# Patient Record
Sex: Male | Born: 1960 | Race: White | Hispanic: No | Marital: Married | Smoking: Never smoker
Health system: Southern US, Community
[De-identification: ages and names within clinical notes are randomized; demographics above are authoritative.]

## PROBLEM LIST (undated history)

## (undated) DIAGNOSIS — H409 Unspecified glaucoma: Secondary | ICD-10-CM

## (undated) DIAGNOSIS — E119 Type 2 diabetes mellitus without complications: Secondary | ICD-10-CM

## (undated) DIAGNOSIS — Z8639 Personal history of other endocrine, nutritional and metabolic disease: Secondary | ICD-10-CM

## (undated) DIAGNOSIS — E875 Hyperkalemia: Secondary | ICD-10-CM

## (undated) DIAGNOSIS — E785 Hyperlipidemia, unspecified: Secondary | ICD-10-CM

## (undated) DIAGNOSIS — N189 Chronic kidney disease, unspecified: Secondary | ICD-10-CM

## (undated) DIAGNOSIS — I10 Essential (primary) hypertension: Secondary | ICD-10-CM

## (undated) DIAGNOSIS — E039 Hypothyroidism, unspecified: Secondary | ICD-10-CM

## (undated) HISTORY — PX: FRACTURE SURGERY: SHX138

## (undated) HISTORY — PX: OTHER SURGICAL HISTORY: SHX169

## (undated) HISTORY — PX: VITRECTOMY: SHX106

## (undated) HISTORY — PX: TRIGGER FINGER RELEASE: SHX641

## (undated) HISTORY — PX: WISDOM TOOTH EXTRACTION: SHX21

## (undated) HISTORY — PX: EYE SURGERY: SHX253

## (undated) HISTORY — PX: CATARACT EXTRACTION, BILATERAL: SHX1313

## (undated) HISTORY — PX: TONSILLECTOMY: SUR1361

---

## 2016-02-22 DIAGNOSIS — E875 Hyperkalemia: Secondary | ICD-10-CM | POA: Insufficient documentation

## 2016-02-22 DIAGNOSIS — I10 Essential (primary) hypertension: Secondary | ICD-10-CM | POA: Diagnosis present

## 2016-05-07 ENCOUNTER — Observation Stay (HOSPITAL_COMMUNITY)
Admission: EM | Admit: 2016-05-07 | Discharge: 2016-05-09 | Disposition: A | Discharge status: Home / Self Care | Payer: 59 | Attending: Family Medicine | Admitting: Family Medicine

## 2016-05-07 ENCOUNTER — Observation Stay (HOSPITAL_COMMUNITY): Payer: 59

## 2016-05-07 ENCOUNTER — Encounter (HOSPITAL_COMMUNITY): Payer: Self-pay | Admitting: Emergency Medicine

## 2016-05-07 DIAGNOSIS — Z794 Long term (current) use of insulin: Secondary | ICD-10-CM | POA: Diagnosis not present

## 2016-05-07 DIAGNOSIS — R1114 Bilious vomiting: Secondary | ICD-10-CM

## 2016-05-07 DIAGNOSIS — E1010 Type 1 diabetes mellitus with ketoacidosis without coma: Secondary | ICD-10-CM | POA: Diagnosis not present

## 2016-05-07 DIAGNOSIS — Z79899 Other long term (current) drug therapy: Secondary | ICD-10-CM | POA: Insufficient documentation

## 2016-05-07 DIAGNOSIS — E860 Dehydration: Secondary | ICD-10-CM | POA: Diagnosis present

## 2016-05-07 DIAGNOSIS — E875 Hyperkalemia: Secondary | ICD-10-CM | POA: Insufficient documentation

## 2016-05-07 DIAGNOSIS — I10 Essential (primary) hypertension: Secondary | ICD-10-CM

## 2016-05-07 DIAGNOSIS — I129 Hypertensive chronic kidney disease with stage 1 through stage 4 chronic kidney disease, or unspecified chronic kidney disease: Secondary | ICD-10-CM | POA: Diagnosis not present

## 2016-05-07 DIAGNOSIS — E039 Hypothyroidism, unspecified: Secondary | ICD-10-CM | POA: Diagnosis present

## 2016-05-07 DIAGNOSIS — Z7952 Long term (current) use of systemic steroids: Secondary | ICD-10-CM | POA: Insufficient documentation

## 2016-05-07 DIAGNOSIS — N179 Acute kidney failure, unspecified: Secondary | ICD-10-CM | POA: Diagnosis not present

## 2016-05-07 DIAGNOSIS — H409 Unspecified glaucoma: Secondary | ICD-10-CM | POA: Insufficient documentation

## 2016-05-07 DIAGNOSIS — E1310 Other specified diabetes mellitus with ketoacidosis without coma: Secondary | ICD-10-CM | POA: Diagnosis present

## 2016-05-07 DIAGNOSIS — E890 Postprocedural hypothyroidism: Secondary | ICD-10-CM

## 2016-05-07 DIAGNOSIS — E1110 Type 2 diabetes mellitus with ketoacidosis without coma: Secondary | ICD-10-CM | POA: Diagnosis present

## 2016-05-07 DIAGNOSIS — Z9641 Presence of insulin pump (external) (internal): Secondary | ICD-10-CM | POA: Diagnosis not present

## 2016-05-07 DIAGNOSIS — E1122 Type 2 diabetes mellitus with diabetic chronic kidney disease: Secondary | ICD-10-CM | POA: Diagnosis not present

## 2016-05-07 DIAGNOSIS — A419 Sepsis, unspecified organism: Secondary | ICD-10-CM

## 2016-05-07 DIAGNOSIS — N189 Chronic kidney disease, unspecified: Secondary | ICD-10-CM | POA: Diagnosis not present

## 2016-05-07 HISTORY — DX: Unspecified glaucoma: H40.9

## 2016-05-07 HISTORY — DX: Personal history of other endocrine, nutritional and metabolic disease: Z86.39

## 2016-05-07 HISTORY — DX: Essential (primary) hypertension: I10

## 2016-05-07 HISTORY — DX: Type 2 diabetes mellitus without complications: E11.9

## 2016-05-07 HISTORY — DX: Chronic kidney disease, unspecified: N18.9

## 2016-05-07 HISTORY — DX: Hyperlipidemia, unspecified: E78.5

## 2016-05-07 HISTORY — DX: Hypothyroidism, unspecified: E03.9

## 2016-05-07 HISTORY — DX: Hyperkalemia: E87.5

## 2016-05-07 LAB — BETA-HYDROXYBUTYRIC ACID: Beta-Hydroxybutyric Acid: 4.2 mmol/L — ABNORMAL HIGH (ref 0.05–0.27)

## 2016-05-07 LAB — RAPID URINE DRUG SCREEN, HOSP PERFORMED
AMPHETAMINES: NOT DETECTED
BENZODIAZEPINES: NOT DETECTED
Barbiturates: NOT DETECTED
Cocaine: NOT DETECTED
OPIATES: NOT DETECTED
Tetrahydrocannabinol: POSITIVE — AB

## 2016-05-07 LAB — BLOOD GAS, VENOUS
Acid-base deficit: 7.5 mmol/L — ABNORMAL HIGH (ref 0.0–2.0)
BICARBONATE: 16.5 mmol/L — AB (ref 20.0–28.0)
FIO2: 0.21
O2 Saturation: 70.9 %
PATIENT TEMPERATURE: 98.6
PCO2 VEN: 30.4 mmHg — AB (ref 44.0–60.0)
PO2 VEN: 42 mmHg (ref 32.0–45.0)
pH, Ven: 7.352 (ref 7.250–7.430)

## 2016-05-07 LAB — URINALYSIS, ROUTINE W REFLEX MICROSCOPIC
BILIRUBIN URINE: NEGATIVE
Bacteria, UA: NONE SEEN
HGB URINE DIPSTICK: NEGATIVE
Ketones, ur: 80 mg/dL — AB
Leukocytes, UA: NEGATIVE
NITRITE: NEGATIVE
Protein, ur: NEGATIVE mg/dL
SPECIFIC GRAVITY, URINE: 1.012 (ref 1.005–1.030)
Squamous Epithelial / LPF: NONE SEEN
pH: 5 (ref 5.0–8.0)

## 2016-05-07 LAB — BASIC METABOLIC PANEL
ANION GAP: 15 (ref 5–15)
BUN: 43 mg/dL — ABNORMAL HIGH (ref 6–20)
CALCIUM: 10.3 mg/dL (ref 8.9–10.3)
CO2: 17 mmol/L — ABNORMAL LOW (ref 22–32)
CREATININE: 1.41 mg/dL — AB (ref 0.61–1.24)
Chloride: 106 mmol/L (ref 101–111)
GFR, EST NON AFRICAN AMERICAN: 54 mL/min — AB (ref >60)
Glucose, Bld: 248 mg/dL — ABNORMAL HIGH (ref 65–99)
Potassium: 4 mmol/L (ref 3.5–5.1)
Sodium: 138 mmol/L (ref 135–145)

## 2016-05-07 LAB — CBC
HCT: 35.5 % — ABNORMAL LOW (ref 39.0–52.0)
HEMOGLOBIN: 13 g/dL (ref 13.0–17.0)
MCH: 31.7 pg (ref 26.0–34.0)
MCHC: 36.6 g/dL — AB (ref 30.0–36.0)
MCV: 86.6 fL (ref 78.0–100.0)
PLATELETS: 207 10*3/uL (ref 150–400)
RBC: 4.1 MIL/uL — AB (ref 4.22–5.81)
RDW: 12.2 % (ref 11.5–15.5)
WBC: 15.4 10*3/uL — ABNORMAL HIGH (ref 4.0–10.5)

## 2016-05-07 LAB — PROCALCITONIN

## 2016-05-07 LAB — TROPONIN I

## 2016-05-07 LAB — MAGNESIUM: MAGNESIUM: 2.2 mg/dL (ref 1.7–2.4)

## 2016-05-07 MED ORDER — SODIUM CHLORIDE 0.9 % IV SOLN
INTRAVENOUS | Status: DC
  Administered 2016-05-08: via INTRAVENOUS

## 2016-05-07 MED ORDER — SODIUM CHLORIDE 0.9 % IV SOLN
INTRAVENOUS | Status: DC
  Administered 2016-05-07: via INTRAVENOUS

## 2016-05-07 MED ORDER — INSULIN REGULAR HUMAN 100 UNIT/ML IJ SOLN: INTRAMUSCULAR | Status: DC

## 2016-05-07 MED ORDER — DEXTROSE-NACL 5-0.45 % IV SOLN
INTRAVENOUS | Status: DC
  Administered 2016-05-08 (×2): via INTRAVENOUS

## 2016-05-07 MED ORDER — PROMETHAZINE HCL 25 MG/ML IJ SOLN
25.0000 mg | Freq: Once | INTRAMUSCULAR | Status: AC
  Administered 2016-05-07: 25 mg via INTRAVENOUS
  Filled 2016-05-07: qty 1

## 2016-05-07 MED ORDER — LACTATED RINGERS IV BOLUS (SEPSIS)
1000.0000 mL | Freq: Once | INTRAVENOUS | Status: AC
  Administered 2016-05-07: 1000 mL via INTRAVENOUS

## 2016-05-07 MED ORDER — ONDANSETRON HCL 4 MG/2ML IJ SOLN
4.0000 mg | Freq: Once | INTRAMUSCULAR | Status: AC
  Administered 2016-05-07: 4 mg via INTRAVENOUS
  Filled 2016-05-07: qty 2

## 2016-05-07 MED ORDER — SODIUM CHLORIDE 0.9 % IV SOLN
INTRAVENOUS | Status: DC
  Administered 2016-05-08: 1.7 [IU]/h via INTRAVENOUS
  Filled 2016-05-07: qty 2.5

## 2016-05-07 MED ORDER — POTASSIUM CHLORIDE CRYS ER 20 MEQ PO TBCR: 20.0000 meq | EXTENDED_RELEASE_TABLET | Freq: Once | ORAL | Status: DC

## 2016-05-07 NOTE — ED Notes (Signed)
Notified RN,Spencer and EDP,Nanavati,MD., pt. I-stat CG4 Lactic acid results 3.67.

## 2016-05-07 NOTE — H&P (Signed)
History and Physical  Patient Name: Eugene Kirby     ZOX:[id number]    DOB: 1960/04/26    DOA: 05/07/2016 PCP: No PCP Per Patient   Patient coming from: Hotel/work  Chief Complaint: Vomiting, "I think I have DKA"  HPI: Eugene Kirby is a 56 y.o. male with a past medical history significant for T1DM on pump, HTN, and hypothyroidism who presents with vomiting for 1 day.  The patient lives in [place] and is in [place] for work. He recently started a new medicine Veltassa for hyperkalemia and over the last 3-4 days, he has had intermittent nausea, upset stomach, and malaise.  He has had no fever, cough, sputum production, dysuria, urinary frequency/urgency, flank pain.  He has had no chest pain/pressure or exertional symptoms, although he had some SOB episodes.  He has been having hyperglycemia, but his insulin pump is working as far as he knows.  Then tonight around 6PM he became suddenly quite a bit worse, much more nauseated, vomiting NBNB emesis repeatedly, with chills, and so he came to the ER.  ED course: -Afebrile, heart rate 92, respirations and pulse is normal, blood pressure 162/77 -Na 138, K 4.0, Cr 1.41 (baseline 0.9-1.0), WBC 15.4K, Hgb 13, BUN 43 -AG normal and VBG without acidosis but BHOB >4 and lactic acid 3.67 -He was given 1L NS and TRH were asked to evaluate  Per wife by phone, he gets DKA about once every 2-3 years, spontaneously.  Recently started on Veltassa by his nephrologist for persistent K>5.      ROS: Review of Systems  Constitutional: Positive for chills and malaise/fatigue.  Respiratory: Positive for shortness of breath (intermittent). Negative for cough, sputum production and wheezing.   Cardiovascular: Negative for chest pain.  Gastrointestinal: Positive for nausea and vomiting.  Genitourinary: Negative for dysuria, flank pain, frequency, hematuria and urgency.  Neurological: Positive for dizziness.  All other systems reviewed and are  negative.         Past Medical History:  Diagnosis Date  . Chronic kidney disease   . Diabetes mellitus without complication (HCC)   . Glaucoma   . Hyperkalemia   . Hypertension   . Hypothyroidism     Past Surgical History:  Procedure Laterality Date  . EYE SURGERY      Social History: Patient lives in [place] with his wife.  The patient walks unassisted.  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  Non-smoker.  Adopted.  No Known Allergies  Family history: family history is not known. He was adopted.  Prior to Admission medications   Medication Sig Start Date End Date Taking? Authorizing Provider  amLODipine (NORVASC) 10 MG tablet Take 10 mg by mouth daily. 04/02/16  Yes Historical Provider, MD  atorvastatin (LIPITOR) 40 MG tablet Take 1 tablet by mouth daily. 04/04/16  Yes Historical Provider, MD  brimonidine (ALPHAGAN) 0.2 % ophthalmic solution Place 1 drop into the left eye 3 (three) times daily. 04/16/16  Yes Historical Provider, MD  cholecalciferol (VITAMIN D) 1000 units tablet Take 1,000 Units by mouth daily.   Yes Historical Provider, MD  dorzolamide-timolol (COSOPT) 22.3-6.8 MG/ML ophthalmic solution Place 1-2 drops into the right eye 2 (two) times daily. 1 drop in right eye daily and 2 drops in the left eye twice daily 04/29/16  Yes Historical Provider, MD  HUMALOG 100 UNIT/ML injection 1 Units every hour. Per pump and carbs 04/04/16  Yes Historical Provider, MD  levothyroxine (SYNTHROID, LEVOTHROID) 75 MCG  tablet Take 1 tablet by mouth daily. 04/02/16  Yes Historical Provider, MD  levothyroxine (SYNTHROID, LEVOTHROID) 88 MCG tablet Take 1 tablet by mouth daily. 04/02/16  Yes Historical Provider, MD  lisinopril-hydrochlorothiazide (PRINZIDE,ZESTORETIC) 20-25 MG tablet Take 1 tablet by mouth daily. 04/23/16  Yes Historical Provider, MD  LUMIGAN 0.01 % SOLN Place 1 drop into the left eye at bedtime. 03/20/16  Yes Historical Provider, MD  omega-3 acid ethyl  esters (LOVAZA) 1 g capsule Take 1 g by mouth 2 (two) times daily.   Yes Historical Provider, MD  prednisoLONE acetate (PRED FORTE) 1 % ophthalmic suspension Place 1 drop into the right eye 3 (three) times daily. 02/28/16  Yes Historical Provider, MD  VELTASSA 8.4 g packet Take 1 packet by mouth daily. 04/23/16  Yes Historical Provider, MD       Physical Exam: BP (!) 150/84   Pulse 92   Temp 97.7 F (36.5 C) (Oral)   Resp 15   Ht 5\' 8"  (1.727 m)   Wt 75.8 kg (167 lb)   SpO2 100%   BMI 25.39 kg/m  General appearance: Well-developed, adult male, alert and in moderate distress from malaise.   Eyes: Anicteric, conjunctiva pink, lids and lashes normal. PERRL.    ENT: No nasal deformity, discharge, epistaxis.  Hearing normal. OP dry.   Neck: No neck masses.  Trachea midline.  No thyromegaly/tenderness. Lymph: No cervical or supraclavicular lymphadenopathy. Skin: Warm and dry.  No jaundice.  No suspicious rashes or lesions. Cardiac: Tachycardic, regular, nl S1-S2, no murmurs appreciated.  Capillary refill is brisk.  JVP not visible.  No LE edema.  Radial and DP pulses 2+ and symmetric. Respiratory: Normal respiratory rate and rhythm.  CTAB without rales or wheezes. Abdomen: Abdomen soft.  No TTP, guarding, rigidity or rebound. No ascites, distension, hepatosplenomegaly.   MSK: No deformities or effusions.  No cyanosis or clubbing. Neuro: Cranial nerves normal.  Sensation intact to light touch. Speech is fluent.  Muscle strength normal.    Psych: Sensorium intact and responding to questions, oriented, appears listless and sluggish, tired, though attention normal.  Affect blunted.  Judgment and insight appear normal.     Labs on Admission:  I have personally reviewed following labs and imaging studies: CBC:  Recent Labs Lab 05/07/16 2116  WBC 15.4*  HGB 13.0  HCT 35.5*  MCV 86.6  PLT 207   Basic Metabolic Panel:  Recent Labs Lab 05/07/16 2116  NA 138  K 4.0  CL 106  CO2  17*  GLUCOSE 248*  BUN 43*  CREATININE 1.41*  CALCIUM 10.3  MG 2.2   GFR: Estimated Creatinine Clearance: 56.6 mL/min (A) (by C-G formula based on SCr of 1.41 mg/dL (H)).  Liver Function Tests: No results for input(s): AST, ALT, ALKPHOS, BILITOT, PROT, ALBUMIN in the last 168 hours. No results for input(s): LIPASE, AMYLASE in the last 168 hours. No results for input(s): AMMONIA in the last 168 hours. Coagulation Profile: No results for input(s): INR, PROTIME in the last 168 hours. Cardiac Enzymes: No results for input(s): CKTOTAL, CKMB, CKMBINDEX, TROPONINI in the last 168 hours. BNP (last 3 results) No results for input(s): PROBNP in the last 8760 hours. HbA1C: No results for input(s): HGBA1C in the last 72 hours. CBG: No results for input(s): GLUCAP in the last 168 hours. Lipid Profile: No results for input(s): CHOL, HDL, LDLCALC, TRIG, CHOLHDL, LDLDIRECT in the last 72 hours. Thyroid Function Tests: No results for input(s): TSH, T4TOTAL, FREET4, T3FREE, THYROIDAB in  the last 72 hours. Anemia Panel: No results for input(s): VITAMINB12, FOLATE, FERRITIN, TIBC, IRON, RETICCTPCT in the last 72 hours. Sepsis Labs: Lactic acid 3.67 Invalid input(s): PROCALCITONIN, LACTICIDVEN No results found for this or any previous visit (from the past 240 hour(s)).       Radiological Exams on Admission: Personally reviewed CXR shows atelectasis, no pneumonia: Dg Chest Port 1 View  Result Date: 05/08/2016 CLINICAL DATA:  Initial evaluation for acute nausea, vomiting. EXAM: PORTABLE CHEST 1 VIEW COMPARISON:  None. FINDINGS: Mild cardiomegaly.  Mediastinal silhouette within normal limits. Lungs are hypoinflated. Mild perihilar vascular congestion without overt pulmonary edema. Linear opacity at the left lung base most consistent with subsegmental atelectasis. No focal infiltrates. No pleural effusion. No pneumothorax. No acute osseus abnormality. IMPRESSION: 1. Shallow lung inflation with mild  left basilar atelectasis. 2. Mild cardiomegaly with perihilar vascular congestion. No overt pulmonary edema. Electronically Signed   By: Rise Mu M.D.   On: 05/08/2016 00:27    EKG: Independently reviewed. Rate 104, QTc 467, sinus tachycardia, no ST changes. Left axis.        Assessment/Plan  1. Diabetic ketoacidosis in type 1 diabetes:  On pump at home.  No clear precipitating event.  I don't see that Veltassa causes DKA.  ?food poisoning (ate sushi last night). -2L NS now -Stop home insulin pump -Start insulin drip per protocol -Obtain chest x-ray -Obtain blood cultures -Obtain troponin -Trend electrolytes and BHOB   2. Hypertension:  Hypertensive on admission. -Continue amlodipine and HCTZ -Hold lisinopril given AKI  3. Acute kidney injury:  Baseline Cr 1.0 per notes, currently 1.4.  In setting of DKA and elevated BUN-creatinine ratio. -Fluids and trend BMP -Hold ACEi  4. Hyperkalemia:  -Hold Veltassa given vomiting  5. Hypothyroidism:  -Continue levothyroxine  6. Glaucoma:  -Hold nonessential eye drops given OBS status  7. Leukocytosis:  Clinically suspect his SIRS is driven by DKA, doubt infection at this time.      DVT prophylaxis: Lovenox  Code Status: FULL  Family Communication: Wife by phone  Disposition Plan: Anticipate IV fluids and insulin, close monitoring of electrolytes in stepdown.  If BHOB resolves overnight and hemodynamics improved/lactate clears, potentially home tomrorow afternoonw. Consults called: None Admission status: OBS At the point of initial evaluation, it is my clinical opinion that admission for OBSERVATION is reasonable and necessary because the patient's presenting complaints in the context of their chronic conditions represent sufficient risk of deterioration or significant morbidity to constitute reasonable grounds for close observation in the hospital setting, but that the patient may be medically stable for  discharge from the hospital within 24 to 48 hours.    Medical decision making: Patient seen at 11:36 PM on 05/07/2016.  The patient was discussed with Dr. Rhunette Croft.  What exists of the patient's chart was reviewed in depth and outsdie records from [facility] in [place] were reviewed and summarized above.  Clinical condition: tachycardic, but improving.  Will monitor in stepdown.        Alberteen Sam [facility] Pager [phone]

## 2016-05-07 NOTE — ED Notes (Signed)
Pt actively vomiting so unable to do ortho vitals

## 2016-05-07 NOTE — ED Notes (Addendum)
Patient's wife requesting updates.  [phone]

## 2016-05-07 NOTE — ED Triage Notes (Addendum)
Patient c/o nausea and vomiting x4 hours. Hx Type 1 diabetes. Patient reports "I may be in DKA." Denies abdominal pain. Patient reports last CBG 263.

## 2016-05-07 NOTE — ED Notes (Signed)
CBG 217  

## 2016-05-08 ENCOUNTER — Encounter (HOSPITAL_COMMUNITY): Payer: Self-pay

## 2016-05-08 DIAGNOSIS — E1310 Other specified diabetes mellitus with ketoacidosis without coma: Secondary | ICD-10-CM | POA: Diagnosis present

## 2016-05-08 DIAGNOSIS — E1010 Type 1 diabetes mellitus with ketoacidosis without coma: Secondary | ICD-10-CM | POA: Diagnosis not present

## 2016-05-08 LAB — GLUCOSE, CAPILLARY
GLUCOSE-CAPILLARY: 233 mg/dL — AB (ref 65–99)
GLUCOSE-CAPILLARY: 203 mg/dL — AB (ref 65–99)
GLUCOSE-CAPILLARY: 241 mg/dL — AB (ref 65–99)
GLUCOSE-CAPILLARY: 214 mg/dL — AB (ref 65–99)
GLUCOSE-CAPILLARY: 218 mg/dL — AB (ref 65–99)
GLUCOSE-CAPILLARY: 180 mg/dL — AB (ref 65–99)
GLUCOSE-CAPILLARY: 356 mg/dL — AB (ref 65–99)
Glucose-Capillary: 217 mg/dL — ABNORMAL HIGH (ref 65–99)
Glucose-Capillary: 220 mg/dL — ABNORMAL HIGH (ref 65–99)
Glucose-Capillary: 203 mg/dL — ABNORMAL HIGH (ref 65–99)
Glucose-Capillary: 173 mg/dL — ABNORMAL HIGH (ref 65–99)
Glucose-Capillary: 142 mg/dL — ABNORMAL HIGH (ref 65–99)
Glucose-Capillary: 137 mg/dL — ABNORMAL HIGH (ref 65–99)
Glucose-Capillary: 187 mg/dL — ABNORMAL HIGH (ref 65–99)
Glucose-Capillary: 342 mg/dL — ABNORMAL HIGH (ref 65–99)
Glucose-Capillary: 256 mg/dL — ABNORMAL HIGH (ref 65–99)

## 2016-05-08 LAB — BASIC METABOLIC PANEL
Anion gap: 13 (ref 5–15)
Anion gap: 9 (ref 5–15)
BUN: 35 mg/dL — AB (ref 6–20)
BUN: 27 mg/dL — AB (ref 6–20)
CALCIUM: 9.2 mg/dL (ref 8.9–10.3)
CHLORIDE: 108 mmol/L (ref 101–111)
CO2: 17 mmol/L — AB (ref 22–32)
CO2: 20 mmol/L — ABNORMAL LOW (ref 22–32)
CREATININE: 1.21 mg/dL (ref 0.61–1.24)
CREATININE: 1.1 mg/dL (ref 0.61–1.24)
Calcium: 9.4 mg/dL (ref 8.9–10.3)
Chloride: 109 mmol/L (ref 101–111)
GFR calc Af Amer: >60 mL/min (ref >60)
GFR calc Af Amer: >60 mL/min (ref >60)
GFR calc non Af Amer: >60 mL/min (ref >60)
GLUCOSE: 195 mg/dL — AB (ref 65–99)
Glucose, Bld: 230 mg/dL — ABNORMAL HIGH (ref 65–99)
Potassium: 3.7 mmol/L (ref 3.5–5.1)
Potassium: 3.4 mmol/L — ABNORMAL LOW (ref 3.5–5.1)
SODIUM: 138 mmol/L (ref 135–145)
Sodium: 138 mmol/L (ref 135–145)

## 2016-05-08 LAB — MRSA PCR SCREENING: MRSA by PCR: NEGATIVE

## 2016-05-08 LAB — CG4 I-STAT (LACTIC ACID): Lactic Acid, Venous: 3.67 mmol/L (ref 0.5–1.9)

## 2016-05-08 LAB — LACTIC ACID, PLASMA: Lactic Acid, Venous: 1.4 mmol/L (ref 0.5–1.9)

## 2016-05-08 LAB — BETA-HYDROXYBUTYRIC ACID: BETA-HYDROXYBUTYRIC ACID: 3.25 mmol/L — AB (ref 0.05–0.27)

## 2016-05-08 LAB — GLUCOSE, RANDOM: GLUCOSE: 365 mg/dL — AB (ref 65–99)

## 2016-05-08 MED ORDER — LEVOTHYROXINE SODIUM 88 MCG PO TABS
88.0000 ug | ORAL_TABLET | Freq: Every day | ORAL | Status: DC
  Administered 2016-05-08 – 2016-05-09 (×2): 88 ug via ORAL
  Filled 2016-05-08 (×2): qty 1

## 2016-05-08 MED ORDER — INSULIN GLARGINE 100 UNIT/ML ~~LOC~~ SOLN
8.0000 [IU] | SUBCUTANEOUS | Status: DC
  Filled 2016-05-08: qty 0.08

## 2016-05-08 MED ORDER — ACETAMINOPHEN 325 MG PO TABS: 650.0000 mg | ORAL_TABLET | Freq: Four times a day (QID) | ORAL | Status: DC | PRN

## 2016-05-08 MED ORDER — ENOXAPARIN SODIUM 40 MG/0.4ML ~~LOC~~ SOLN
40.0000 mg | Freq: Every day | SUBCUTANEOUS | Status: DC
  Administered 2016-05-08: 40 mg via SUBCUTANEOUS
  Filled 2016-05-08: qty 0.4

## 2016-05-08 MED ORDER — AMLODIPINE BESYLATE 10 MG PO TABS
10.0000 mg | ORAL_TABLET | Freq: Every day | ORAL | Status: DC
  Administered 2016-05-08 – 2016-05-09 (×2): 10 mg via ORAL
  Filled 2016-05-08 (×2): qty 1

## 2016-05-08 MED ORDER — HYDRALAZINE HCL 20 MG/ML IJ SOLN
10.0000 mg | Freq: Once | INTRAMUSCULAR | Status: AC
  Administered 2016-05-08: 10 mg via INTRAVENOUS
  Filled 2016-05-08: qty 1

## 2016-05-08 MED ORDER — ONDANSETRON HCL 4 MG/2ML IJ SOLN
4.0000 mg | Freq: Four times a day (QID) | INTRAMUSCULAR | Status: DC | PRN
  Administered 2016-05-08 (×2): 4 mg via INTRAVENOUS
  Filled 2016-05-08 (×2): qty 2

## 2016-05-08 MED ORDER — CALCIUM CARBONATE ANTACID 500 MG PO CHEW: 1.0000 | CHEWABLE_TABLET | Freq: Every day | ORAL | Status: DC | PRN

## 2016-05-08 MED ORDER — ATORVASTATIN CALCIUM 40 MG PO TABS
40.0000 mg | ORAL_TABLET | Freq: Every day | ORAL | Status: DC
  Administered 2016-05-08 – 2016-05-09 (×2): 40 mg via ORAL
  Filled 2016-05-08 (×2): qty 1

## 2016-05-08 MED ORDER — FAMOTIDINE 20 MG PO TABS
40.0000 mg | ORAL_TABLET | Freq: Every day | ORAL | Status: DC
  Administered 2016-05-08 – 2016-05-09 (×2): 40 mg via ORAL
  Filled 2016-05-08 (×2): qty 2

## 2016-05-08 MED ORDER — SIMETHICONE 80 MG PO CHEW: 80.0000 mg | CHEWABLE_TABLET | Freq: Every day | ORAL | Status: DC | PRN

## 2016-05-08 MED ORDER — PROMETHAZINE HCL 25 MG/ML IJ SOLN
12.5000 mg | INTRAMUSCULAR | Status: DC | PRN
  Administered 2016-05-08 – 2016-05-09 (×4): 25 mg via INTRAVENOUS
  Filled 2016-05-08 (×4): qty 1

## 2016-05-08 MED ORDER — POTASSIUM CHLORIDE 20 MEQ/15ML (10%) PO SOLN
20.0000 meq | Freq: Once | ORAL | Status: AC
  Administered 2016-05-08: 20 meq via ORAL
  Filled 2016-05-08: qty 15

## 2016-05-08 MED ORDER — INSULIN PUMP
Freq: Three times a day (TID) | SUBCUTANEOUS | Status: DC
  Administered 2016-05-08: 5.6 via SUBCUTANEOUS
  Administered 2016-05-08: 1 via SUBCUTANEOUS
  Administered 2016-05-08: 4 via SUBCUTANEOUS
  Administered 2016-05-09: 1.2 via SUBCUTANEOUS
  Administered 2016-05-09: 02:00:00 via SUBCUTANEOUS
  Filled 2016-05-08: qty 1

## 2016-05-08 MED ORDER — HYDROCHLOROTHIAZIDE 25 MG PO TABS
25.0000 mg | ORAL_TABLET | Freq: Every day | ORAL | Status: DC
  Administered 2016-05-08 – 2016-05-09 (×2): 25 mg via ORAL
  Filled 2016-05-08 (×2): qty 1

## 2016-05-08 MED ORDER — SODIUM CHLORIDE 0.9 % IV BOLUS (SEPSIS)
1000.0000 mL | Freq: Once | INTRAVENOUS | Status: AC
  Administered 2016-05-08: 1000 mL via INTRAVENOUS

## 2016-05-08 MED ORDER — ONDANSETRON HCL 4 MG PO TABS: 4.0000 mg | ORAL_TABLET | Freq: Four times a day (QID) | ORAL | Status: DC | PRN

## 2016-05-08 MED ORDER — LEVOTHYROXINE SODIUM 50 MCG PO TABS
75.0000 ug | ORAL_TABLET | Freq: Every day | ORAL | Status: DC
  Administered 2016-05-08 – 2016-05-09 (×2): 75 ug via ORAL
  Filled 2016-05-08 (×2): qty 1

## 2016-05-08 MED ORDER — PROMETHAZINE HCL 25 MG/ML IJ SOLN
25.0000 mg | Freq: Once | INTRAMUSCULAR | Status: AC
  Administered 2016-05-08: 25 mg via INTRAVENOUS
  Filled 2016-05-08: qty 1

## 2016-05-08 MED ORDER — LABETALOL HCL 5 MG/ML IV SOLN
10.0000 mg | INTRAVENOUS | Status: AC | PRN
  Administered 2016-05-08 (×2): 10 mg via INTRAVENOUS
  Filled 2016-05-08 (×2): qty 4

## 2016-05-08 MED ORDER — ACETAMINOPHEN 650 MG RE SUPP: 650.0000 mg | Freq: Four times a day (QID) | RECTAL | Status: DC | PRN

## 2016-05-08 NOTE — Care Management Note (Signed)
Case Management Note  Patient Details  Name: Eugene Kirby MRN: [id number] Date of Birth: 19-May-1960  Subjective/Objective:      Hyperglycemia and poss early sepsis              Action/Plan:Date:  May 08, 2016 Chart reviewed for concurrent status and case management needs. Will continue to follow patient progress. Discharge Planning: following for needs Expected discharge date: [id number] Marcelle SmilingRhonda Loris Winrow, BSN, [place] RunRN3, [place]M   [phone]   Expected Discharge Date:   (unknown)               Expected Discharge Plan:  Home/Self Care  In-House Referral:     Discharge planning Services     Post Acute Care Choice:    Choice offered to:     DME Arranged:    DME Agency:     HH Arranged:    HH Agency:     Status of Service:  In process, will continue to follow  If discussed at Long Length of Stay Meetings, dates discussed:    Additional Comments:  Golda AcreDavis, Clarise Chacko Lynn, RN 05/08/2016, 9:51 AM

## 2016-05-08 NOTE — Progress Notes (Signed)
Eugene Kirby FHL:[id number] DOB: March 08, 1960 DOA: 05/07/2016 PCP: No PCP Per Patient  Brief narrative:  56 y/o ? from [place] visiting [place] 1 DM x 50 yr with functioning insulin pump [medtronic] placed 7 yrs ago Htn Hypothyroid HLD Glaucoma? Hyperkalemia newly started on potassium binder Veltassa ~ 2 wk prior under care [facility] Dr. Collie Siad  Admit with n/v for 3-4 days and found to be in DKA   Past medical history-As per Problem list Chart reviewed as below-   Consultants:    Procedures:    Antibiotics:     Subjective   Nauseated.  No vomit just yet. No diarrhea No chills no fever   Objective    Interim History:   Telemetry:    Objective: Vitals:   05/08/16 0317 05/08/16 0400 05/08/16 0500 05/08/16 0600  BP:  (!) 168/48 (!) 151/50 (!) 164/52  Pulse:  (!) 124 (!) 111 (!) 124  Resp:  (!) 27 (!) 23 (!) 26  Temp: 98.2 F (36.8 C)     TempSrc: Oral     SpO2:  97% 98% 98%  Weight:      Height:        Intake/Output Summary (Last 24 hours) at 05/08/16 0741 Last data filed at 05/08/16 0500  Gross per 24 hour  Intake          2253.58 ml  Output              950 ml  Net          1303.58 ml    Exam:  General: eomi ncat.  Looks fair Cardiovascular: s1 s 2 slight tachy Respiratory: clear no adde dsound Abdomen:  Soft-pump presnet Skin no le edema [place]  Data Reviewed: Basic Metabolic Panel:  Recent Labs Lab 05/07/16 2116 05/08/16 0244 05/08/16 0659  NA 138 138 138  K 4.0 3.7 3.4*  CL 106 108 109  CO2 17* 17* 20*  GLUCOSE 248* 230* 195*  BUN 43* 35* 27*  CREATININE 1.41* 1.21 1.10  CALCIUM 10.3 9.4 9.2  MG 2.2  --   --    Liver Function Tests: No results for input(s): AST, ALT, ALKPHOS, BILITOT, PROT, ALBUMIN in the last 168 hours. No results for input(s): LIPASE, AMYLASE in the last 168 hours. No results for input(s): AMMONIA in the last 168 hours. CBC:  Recent Labs Lab 05/07/16 2116  WBC 15.4*  HGB 13.0    HCT 35.5*  MCV 86.6  PLT 207   Cardiac Enzymes:  Recent Labs Lab 05/07/16 2116  TROPONINI <0.03   BNP: Invalid input(s): POCBNP CBG:  Recent Labs Lab 05/08/16 0217 05/08/16 0316 05/08/16 0414 05/08/16 0522 05/08/16 0623  GLUCAP 241* 214* 218* 180* 203*    Recent Results (from the past 240 hour(s))  MRSA PCR Screening     Status: None   Collection Time: 05/08/16  1:20 AM  Result Value Ref Range Status   MRSA by PCR NEGATIVE NEGATIVE Final    Comment:        The GeneXpert MRSA Assay (FDA approved for NASAL specimens only), is one component of a comprehensive MRSA colonization surveillance program. It is not intended to diagnose MRSA infection nor to guide or monitor treatment for MRSA infections.      Studies:              All Imaging reviewed and is as per above notation   Scheduled Meds: . amLODipine  10 mg Oral Daily  . atorvastatin  40 mg Oral Daily  . enoxaparin (LOVENOX) injection  40 mg Subcutaneous QHS  . hydrochlorothiazide  25 mg Oral Daily  . levothyroxine  75 mcg Oral QAC breakfast  . levothyroxine  88 mcg Oral QAC breakfast   Continuous Infusions: . sodium chloride Stopped (05/08/16 0115)  . dextrose 5 % and 0.45% NaCl 125 mL/hr at 05/08/16 0400  . insulin (NOVOLIN-R) infusion 6.8 Units/hr (05/08/16 0736)     Assessment/Plan:  1. DKA-gap closed quickly, still mild met acidosis with co2 20-no longer requires SDU status and can tx to reg floor if stabilizes in next 6-8 hr. 2. N/v ? Related to DKA vs viral illness.  If persists get lipase and rule out other organic causes 3. Ty1 DM with insulin pump-Might need to have it checked.  WIll ask DM coordinator to see--check sugars q4 given nausea 4. Htn-resumed HCTZ 25, Amlodipine 10 5. Acute kindey injury, admit BUn/Creat 43/1.4-->27/1.1 in setting DKA/Vomiting-resolved with IVF-for now continue Dextrose and increase to 150 cc/hr 6. Hypothyroidism-cont synthroid 88 mcg-verify dose on  d/c 7. hld-cont lipitor 40 daily    Transfer to Med surg later today Monitor overnight as still quesy and hasnt taken PO yet and needs recheck labs for hypokalemia Changing to inpatient  Verneita Griffes, MD  [facility] Pager [phone] 05/08/2016, 7:41 AM    LOS: 0 days

## 2016-05-08 NOTE — Progress Notes (Signed)
Pt hypertensive to the 180s SBP. Donnamarie PoagK. Kirby notified. 10mg  Hydralazine ordered and given.

## 2016-05-08 NOTE — Progress Notes (Signed)
Inpatient Diabetes Program Recommendations  AACE/ADA: New Consensus Statement on Inpatient Glycemic Control (2015)  Target Ranges:  Prepandial:   less than 140 mg/dL      Peak postprandial:   less than 180 mg/dL (1-2 hours)      Critically ill patients:  140 - 180 mg/dL   Lab Results  Component Value Date   GLUCAP 137 (H) 05/08/2016    Review of Glycemic Control  Diabetes history: DM1 Outpatient Diabetes medications: Insulin Pump Current orders for Inpatient glycemic control: IV insulin to insulin pump  Inpatient Diabetes Program Recommendations:    Continue insulin drip x 1 hour after insulin pump restarted.  Pt has CGM and will check blood sugars and adjust according to hospital meter. Will have pt sign Insulin Pump Contract and give sheet to record amount that he boluses.  Insulin Pump Settings: Basal: 12 MN - 1.3 units 0430  - 1.55 units Total: appox 51 units  Bolus: Goal 120 mg/dL 12MN - 40 [id number] - 50 CHO ratio: 1:8  Pt has new site. Feels like he may have gotten a "bug" or ate something bad. (Had sushi PTA). Had DM1 since age 89. Very well managed. Has CGM. Sees endo on regular basis in [place]. Answered questions. Will follow while inpatient.  Thank you. Ailene Ardshonda Deysi Soldo, RD, LDN, CDE Inpatient Diabetes Coordinator [phone]

## 2016-05-08 NOTE — ED Provider Notes (Signed)
[facility]-EMERGENCY DEPT Provider Note   CSN: [id number] Arrival date & time: 05/07/16  2032     History   Chief Complaint Chief Complaint  Patient presents with  . Hyperglycemia    HPI Eugene Kirby is a 56 y.o. male.  HPI  Pt comes in with cc of nausea and emesis. Pt has hx of Type 1 DM. Pt reports that he started feeling unwell during the day, and in the evening he started having severe nausea and emesis. Pt has had 20+ rounds of emesis, now bilious. Pt has had 3 loose BM. Pt denies any severe abd pain. Patient has no pain with urination, blood in the urine, or frequent urination.Pt denies any recent travel hx, or suspicious po intake. Pt started getting dizzy and felt that he might faint prior to ER arrival.  Past Medical History:  Diagnosis Date  . Chronic kidney disease   . Diabetes mellitus without complication (HCC)   . Glaucoma   . Hyperkalemia   . Hypertension   . Hypothyroidism     Patient Active Problem List   Diagnosis Date Noted  . DKA (diabetic ketoacidoses) (HCC) 05/07/2016  . Glaucoma 05/07/2016  . Hypothyroidism 05/07/2016  . AKI (acute kidney injury) (HCC) 05/07/2016  . Essential hypertension 02/22/2016  . Hyperkalemia 02/22/2016    Past Surgical History:  Procedure Laterality Date  . EYE SURGERY         Home Medications    Prior to Admission medications   Medication Sig Start Date End Date Taking? Authorizing Provider  amLODipine (NORVASC) 10 MG tablet Take 10 mg by mouth daily. 04/02/16  Yes Historical Provider, MD  atorvastatin (LIPITOR) 40 MG tablet Take 1 tablet by mouth daily. 04/04/16  Yes Historical Provider, MD  brimonidine (ALPHAGAN) 0.2 % ophthalmic solution Place 1 drop into the left eye 3 (three) times daily. 04/16/16  Yes Historical Provider, MD  cholecalciferol (VITAMIN D) 1000 units tablet Take 1,000 Units by mouth daily.   Yes Historical Provider, MD  dorzolamide-timolol (COSOPT) 22.3-6.8 MG/ML ophthalmic solution Place 1-2 drops  into the right eye 2 (two) times daily. 1 drop in right eye daily and 2 drops in the left eye twice daily 04/29/16  Yes Historical Provider, MD  HUMALOG 100 UNIT/ML injection 1 Units every hour. Per pump and carbs 04/04/16  Yes Historical Provider, MD  levothyroxine (SYNTHROID, LEVOTHROID) 75 MCG tablet Take 1 tablet by mouth daily. 04/02/16  Yes Historical Provider, MD  levothyroxine (SYNTHROID, LEVOTHROID) 88 MCG tablet Take 1 tablet by mouth daily. 04/02/16  Yes Historical Provider, MD  lisinopril-hydrochlorothiazide (PRINZIDE,ZESTORETIC) 20-25 MG tablet Take 1 tablet by mouth daily. 04/23/16  Yes Historical Provider, MD  LUMIGAN 0.01 % SOLN Place 1 drop into the left eye at bedtime. 03/20/16  Yes Historical Provider, MD  omega-3 acid ethyl esters (LOVAZA) 1 g capsule Take 1 g by mouth 2 (two) times daily.   Yes Historical Provider, MD  prednisoLONE acetate (PRED FORTE) 1 % ophthalmic suspension Place 1 drop into the right eye 3 (three) times daily. 02/28/16  Yes Historical Provider, MD  VELTASSA 8.4 g packet Take 1 packet by mouth daily. 04/23/16  Yes Historical Provider, MD    Family History Family History  Problem Relation Age of Onset  . Adopted: Yes    Social History Social History  Substance Use Topics  . Smoking status: Never Smoker  . Smokeless tobacco: Never Used  . Alcohol use Not on file     Allergies   Patient  has no known allergies.   Review of Systems Review of Systems  ROS 10 Systems reviewed and are negative for acute change except as noted in the HPI.     Physical Exam Updated Vital Signs BP (!) 160/64 (BP Location: Left Arm)   Pulse (!) 102   Temp 97.7 F (36.5 C) (Oral)   Resp 11   Ht 5\' 8"  (1.727 m)   Wt 167 lb (75.8 kg)   SpO2 100%   BMI 25.39 kg/m   Physical Exam  Constitutional: He is oriented to person, place, and time. He appears well-developed.  HENT:  Head: Normocephalic and atraumatic.  Dry mucus membrane  Eyes: Conjunctivae and EOM are  normal. Pupils are equal, round, and reactive to light.  Neck: Normal range of motion. Neck supple.  Cardiovascular: Normal rate and regular rhythm.   tachycardia  Pulmonary/Chest: Effort normal and breath sounds normal.  Abdominal: Soft. Bowel sounds are normal. He exhibits no distension and no mass. There is no tenderness. There is no rebound and no guarding.  Musculoskeletal: He exhibits no deformity.  Neurological: He is alert and oriented to person, place, and time.  Skin: Skin is warm.  Nursing note and vitals reviewed.    ED Treatments / Results  Labs (all labs ordered are listed, but only abnormal results are displayed) Labs Reviewed  BASIC METABOLIC PANEL - Abnormal; Notable for the following:       Result Value   CO2 17 (*)    Glucose, Bld 248 (*)    BUN 43 (*)    Creatinine, Ser 1.41 (*)    GFR calc non Af Amer 54 (*)    All other components within normal limits  CBC - Abnormal; Notable for the following:    WBC 15.4 (*)    RBC 4.10 (*)    HCT 35.5 (*)    MCHC 36.6 (*)    All other components within normal limits  URINALYSIS, ROUTINE W REFLEX MICROSCOPIC - Abnormal; Notable for the following:    Color, Urine STRAW (*)    Glucose, UA >=500 (*)    Ketones, ur 80 (*)    All other components within normal limits  BETA-HYDROXYBUTYRIC ACID - Abnormal; Notable for the following:    Beta-Hydroxybutyric Acid 4.20 (*)    All other components within normal limits  BLOOD GAS, VENOUS - Abnormal; Notable for the following:    pCO2, Ven 30.4 (*)    Bicarbonate 16.5 (*)    Acid-base deficit 7.5 (*)    All other components within normal limits  RAPID URINE DRUG SCREEN, HOSP PERFORMED - Abnormal; Notable for the following:    Tetrahydrocannabinol POSITIVE (*)    All other components within normal limits  CULTURE, BLOOD (ROUTINE X 2)  CULTURE, BLOOD (ROUTINE X 2)  URINE CULTURE  MAGNESIUM  PROCALCITONIN  TROPONIN I  BASIC METABOLIC PANEL  LACTIC ACID, PLASMA  LACTIC  ACID, PLASMA  HEMOGLOBIN A1C  CBG MONITORING, ED  I-STAT CG4 LACTIC ACID, ED    EKG  EKG Interpretation  Date/Time:  Wednesday May 07 2016 23:28:24 EDT Ventricular Rate:  104 PR Interval:    QRS Duration: 92 QT Interval:  355 QTC Calculation: 467 R Axis:   -44 Text Interpretation:  Sinus tachycardia Left axis deviation No old tracing to compare No acute changes Confirmed by Rhunette CroftNANAVATI, MD, Janey GentaANKIT [phone]([id number]) on 05/08/2016 12:57:28 AM       Radiology Dg Chest Port 1 View  Result Date: 05/08/2016 CLINICAL DATA:  Initial evaluation for acute nausea, vomiting. EXAM: PORTABLE CHEST 1 VIEW COMPARISON:  None. FINDINGS: Mild cardiomegaly.  Mediastinal silhouette within normal limits. Lungs are hypoinflated. Mild perihilar vascular congestion without overt pulmonary edema. Linear opacity at the left lung base most consistent with subsegmental atelectasis. No focal infiltrates. No pleural effusion. No pneumothorax. No acute osseus abnormality. IMPRESSION: 1. Shallow lung inflation with mild left basilar atelectasis. 2. Mild cardiomegaly with perihilar vascular congestion. No overt pulmonary edema. Electronically Signed   By: Rise Mu M.D.   On: 05/08/2016 00:27    Procedures Procedures (including critical care time)  CRITICAL CARE Performed by: Derwood Kaplan   Total critical care time: 45 minutes  Critical care time was exclusive of separately billable procedures and treating other patients.  Critical care was necessary to treat or prevent imminent or life-threatening deterioration.  Critical care was time spent personally by me on the following activities: development of treatment plan with patient and/or surrogate as well as nursing, discussions with consultants, evaluation of patient's response to treatment, examination of patient, obtaining history from patient or surrogate, ordering and performing treatments and interventions, ordering and review of laboratory studies,  ordering and review of radiographic studies, pulse oximetry and re-evaluation of patient's condition.   Medications Ordered in ED Medications  0.9 %  sodium chloride infusion ( Intravenous Stopped 05/08/16 0019)  0.9 %  sodium chloride infusion ( Intravenous New Bag/Given 05/08/16 0001)  dextrose 5 %-0.45 % sodium chloride infusion (not administered)  insulin regular (NOVOLIN R,HUMULIN R) 250 Units in sodium chloride 0.9 % 250 mL (1 Units/mL) infusion (1.7 Units/hr Intravenous New Bag/Given 05/08/16 0002)  lactated ringers bolus 1,000 mL (0 mLs Intravenous Stopped 05/07/16 2358)  ondansetron (ZOFRAN) injection 4 mg (4 mg Intravenous Given 05/07/16 2157)  promethazine (PHENERGAN) injection 25 mg (25 mg Intravenous Given 05/07/16 2225)     Initial Impression / Assessment and Plan / ED Course  I have reviewed the triage vital signs and the nursing notes.  Pertinent labs & imaging results that were available during my care of the patient were reviewed by me and considered in my medical decision making (see chart for details).     Pt comes in with cc of nausea, emesis. He is diabetic. Pt is dehydrated and having orthostatic dizziness.  Labs reveals low bicarb of 17.  The metabolic acidosis could be due to starvation ketosis, or could be due to an early DKA. Anion gap is at 15. BOHB is elevated-  So after talking to medicine, we have decided to start DKA protocol.  Pt's lactate > 3. The patient is noted to have a lactate>2. With the current information available to me, I don't think the patient is in septic shock. The lactate>2, is related to OTHER SHOCK DKA and dehydration. Therefore, I have not ordered any blood cultures or antibiotics.   Final Clinical Impressions(s) / ED Diagnoses   Final diagnoses:  Type 1 diabetes mellitus with ketoacidosis without coma (HCC)  Bilious vomiting with nausea  Severe dehydration  AKI (acute kidney injury) [facility])    New Prescriptions New Prescriptions    No medications on file     Derwood Kaplan, MD 05/08/16 0106

## 2016-05-08 NOTE — Progress Notes (Signed)
Transferred to room 1337 via w/c. C/o dry heaves .

## 2016-05-08 NOTE — Progress Notes (Signed)
Pt still hypertensive SBP high 160s. Tachycardic to the 120s and continued N/V after zofran. Kirby notified. 1L NS bolus ordered and given, 10mg  labetalol ordered and given, 25mg  phenergan ordered and given.

## 2016-05-08 NOTE — ED Notes (Signed)
BSG 233, [facility] did not transfer over.

## 2016-05-08 NOTE — Progress Notes (Signed)
Inpatient Diabetes Program Recommendations  AACE/ADA: New Consensus Statement on Inpatient Glycemic Control (2015)  Target Ranges:  Prepandial:   less than 140 mg/dL      Peak postprandial:   less than 180 mg/dL (1-2 hours)      Critically ill patients:  140 - 180 mg/dL   Lab Results  Component Value Date   GLUCAP 142 (H) 05/08/2016   Inpatient Diabetes Program Recommendations:  Spoke to RN caring for patient- she had placed a page to the on call coordinator.  Patient wears an insulin pump- MD is ready to transition patient off insulin drip to insulin pump as gap is closed and CO2 is 20.  Patient will need to call the 1-800 number on the back of the pump and complete a safety check with the pump before restarting it.  Patient should use a new tubing and new site to restart pump.    Do not recommend Lantus insulin now if patient is to transition to the pump as the pump will deliver basal and bolus insulin.   Susette RacerJulie Danyel Griess, RN, BA, MHA, CDE Diabetes Coordinator Inpatient Diabetes Program  [phone] (Team Pager) [phone] [facility] Office) 05/08/2016 9:19 AM

## 2016-05-08 NOTE — Progress Notes (Signed)
Insulin drip stopped at 1045 ,or one hour after insulin pump started by patient. CBG 187 by hospital meter and 186 per pts insulin pump. Patient gave 1 unit insulin per insulin pump.Nausea was relieved with phenergan 12.5 mgm iv.Appetite is poor at this time.

## 2016-05-09 DIAGNOSIS — E1010 Type 1 diabetes mellitus with ketoacidosis without coma: Secondary | ICD-10-CM | POA: Diagnosis not present

## 2016-05-09 LAB — CBC WITH DIFFERENTIAL/PLATELET
BASOS ABS: 0 10*3/uL (ref 0.0–0.1)
Basophils Relative: 0 %
Eosinophils Absolute: 0 10*3/uL (ref 0.0–0.7)
Eosinophils Relative: 0 %
HEMATOCRIT: 35.7 % — AB (ref 39.0–52.0)
HEMOGLOBIN: 12.8 g/dL — AB (ref 13.0–17.0)
LYMPHS PCT: 14 %
Lymphs Abs: 2.1 10*3/uL (ref 0.7–4.0)
MCH: 32.2 pg (ref 26.0–34.0)
MCHC: 35.9 g/dL (ref 30.0–36.0)
MCV: 89.7 fL (ref 78.0–100.0)
MONO ABS: 1.3 10*3/uL — AB (ref 0.1–1.0)
MONOS PCT: 9 %
NEUTROS ABS: 11.5 10*3/uL — AB (ref 1.7–7.7)
NEUTROS PCT: 77 %
Platelets: 195 10*3/uL (ref 150–400)
RBC: 3.98 MIL/uL — ABNORMAL LOW (ref 4.22–5.81)
RDW: 12.3 % (ref 11.5–15.5)
WBC: 14.9 10*3/uL — ABNORMAL HIGH (ref 4.0–10.5)

## 2016-05-09 LAB — BASIC METABOLIC PANEL
Anion gap: 8 (ref 5–15)
BUN: 22 mg/dL — ABNORMAL HIGH (ref 6–20)
CHLORIDE: 105 mmol/L (ref 101–111)
CO2: 25 mmol/L (ref 22–32)
Calcium: 9.8 mg/dL (ref 8.9–10.3)
Creatinine, Ser: 1.02 mg/dL (ref 0.61–1.24)
GLUCOSE: 225 mg/dL — AB (ref 65–99)
POTASSIUM: 3.8 mmol/L (ref 3.5–5.1)
SODIUM: 138 mmol/L (ref 135–145)

## 2016-05-09 LAB — GLUCOSE, CAPILLARY
GLUCOSE-CAPILLARY: 178 mg/dL — AB (ref 65–99)
Glucose-Capillary: 224 mg/dL — ABNORMAL HIGH (ref 65–99)

## 2016-05-09 LAB — URINE CULTURE: CULTURE: NO GROWTH

## 2016-05-09 LAB — HEMOGLOBIN A1C
Hgb A1c MFr Bld: 8.4 % — ABNORMAL HIGH (ref 4.8–5.6)
Mean Plasma Glucose: 194 mg/dL

## 2016-05-09 LAB — PROCALCITONIN: Procalcitonin: 6.7 ng/mL

## 2016-05-09 MED ORDER — FAMOTIDINE 40 MG PO TABS: 40.0000 mg | ORAL_TABLET | Freq: Every day | ORAL | 0 refills | Status: AC

## 2016-05-09 MED ORDER — ONDANSETRON HCL 4 MG PO TABS: 4.0000 mg | ORAL_TABLET | Freq: Three times a day (TID) | ORAL | 0 refills | Status: AC | PRN

## 2016-05-09 NOTE — Progress Notes (Signed)
Pt discharged home with spouse in stable condition. Discharge instructions and scripts given. Pt verbalized understanding. No immediate questions or concerns at this time.  

## 2016-05-09 NOTE — Discharge Summary (Signed)
Physician Discharge Summary  Eugene Kirby:[id number] DOB: [birth year] DOA: 05/07/2016  PCP: No PCP Per Patient  Admit date: 05/07/2016 Discharge date: 05/09/2016  Time spent: 45 minutes  Recommendations for Outpatient Follow-up:  1. Holding Prinvil 2/2 to hyperkalemia 2. Needs bmet 1 wk 3. Recommend outpatient titration of diabetes mellitus and diabetic goals 4. Might require TSH as an outpatient  Discharge Diagnoses:  Principal Problem:   DKA (diabetic ketoacidoses) (HCC) Active Problems:   Essential hypertension   Hypothyroidism   AKI (acute kidney injury) (HCC)   Secondary DM with DKA (HCC)   Discharge Condition: Good  Diet recommendation: Diabetic heart healthy  Filed Weights   05/07/16 2042  Weight: 75.8 kg (167 lb)    History of present illness:  56 y/o ? from [place] visiting [place] 1 DM x 50 yr with functioning insulin pump [medtronic] placed 7 yrs ago Htn Hypothyroid HLD Glaucoma? Hyperkalemia newly started on potassium binder Veltassa ~ 2 wk prior under care [facility] Dr. Dana Allan  Admit with n/v for 3-4 days and found to be in DKA  DKA resolved very quickly and was under control within 24 hours. The patient also was taken off of his Prinivil because of better controlled hypertension. He was also discontinued off of his potassium binder which was not needed as lisinopril was the cause of hyperkalemia and this was discontinued He still had some nausea and was given a prescription for Zofran ODT and Pepcid on discharge and was felt to be stable for discharge and will follow with his primary physician moving forward  Discharge Exam: Vitals:   05/08/16 2024 05/09/16 0417  BP: 109/66 119/82  Pulse:  93  Resp: (!) 22 20  Temp: 98.7 F (37.1 C) 98.6 F (37 C)   EOMI NCAT Alert pleasant oriented no apparent current distress abd sioft nt nd no rebound     Discharge Instructions   Discharge Instructions    Diet - low sodium heart healthy     Complete by:  As directed    Discharge instructions    Complete by:  As directed    I would recommend that you hold off on taking your Prinvil for the time being as your blood sugar has stabilized and you have not really needed any more medication than amlodipine at this time-in the near future your nephrologist will determine if he wants to try a different type of blood pressure medicine that helps preserved kidney function in patients with diabetes At this juncture I'll see her potassium is within the normal range you do not need to take Veltess and will need lab work done at your primary physician's office in about 7-10 days We have prescribed due to paper scripts of Zofran and Pepcid in case he feels nauseous on the road Best of luck and happy Easter   Increase activity slowly    Complete by:  As directed      Current Discharge Medication List    START taking these medications   Details  famotidine (PEPCID) 40 MG tablet Take 1 tablet (40 mg total) by mouth daily. Qty: 30 tablet, Refills: 0    ondansetron (ZOFRAN) 4 MG tablet Take 1 tablet (4 mg total) by mouth every 8 (eight) hours as needed for nausea or vomiting. Qty: 20 tablet, Refills: 0      CONTINUE these medications which have NOT CHANGED   Details  amLODipine (NORVASC) 10 MG tablet Take 10 mg by mouth daily. Refills: 1  atorvastatin (LIPITOR) 40 MG tablet Take 1 tablet by mouth daily.    brimonidine (ALPHAGAN) 0.2 % ophthalmic solution Place 1 drop into the left eye 3 (three) times daily. Refills: 6    cholecalciferol (VITAMIN D) 1000 units tablet Take 1,000 Units by mouth daily.    dorzolamide-timolol (COSOPT) 22.3-6.8 MG/ML ophthalmic solution Place 1-2 drops into the right eye 2 (two) times daily. 1 drop in right eye daily and 2 drops in the left eye twice daily    HUMALOG 100 UNIT/ML injection 1 Units every hour. Per pump and carbs    !! levothyroxine (SYNTHROID, LEVOTHROID) 75 MCG tablet Take 1 tablet by  mouth daily.    !! levothyroxine (SYNTHROID, LEVOTHROID) 88 MCG tablet Take 1 tablet by mouth daily.    LUMIGAN 0.01 % SOLN Place 1 drop into the left eye at bedtime. Refills: 6    omega-3 acid ethyl esters (LOVAZA) 1 g capsule Take 1 g by mouth 2 (two) times daily.    prednisoLONE acetate (PRED FORTE) 1 % ophthalmic suspension Place 1 drop into the right eye 3 (three) times daily.     !! - Potential duplicate medications found. Please discuss with provider.    STOP taking these medications     lisinopril-hydrochlorothiazide (PRINZIDE,ZESTORETIC) 20-25 MG tablet      VELTASSA 8.4 g packet        No Known Allergies    The results of significant diagnostics from this hospitalization (including imaging, microbiology, ancillary and laboratory) are listed below for reference.    Significant Diagnostic Studies: Dg Chest Port 1 View  Result Date: 05/08/2016 CLINICAL DATA:  Initial evaluation for acute nausea, vomiting. EXAM: PORTABLE CHEST 1 VIEW COMPARISON:  None. FINDINGS: Mild cardiomegaly.  Mediastinal silhouette within normal limits. Lungs are hypoinflated. Mild perihilar vascular congestion without overt pulmonary edema. Linear opacity at the left lung base most consistent with subsegmental atelectasis. No focal infiltrates. No pleural effusion. No pneumothorax. No acute osseus abnormality. IMPRESSION: 1. Shallow lung inflation with mild left basilar atelectasis. 2. Mild cardiomegaly with perihilar vascular congestion. No overt pulmonary edema. Electronically Signed   By: Rise Mu M.D.   On: 05/08/2016 00:27    Microbiology: Recent Results (from the past 240 hour(s))  Culture, blood (routine x 2)     Status: None (Preliminary result)   Collection Time: 05/07/16 10:43 PM  Result Value Ref Range Status   Specimen Description BLOOD RIGHT HAND  Final   Special Requests IN PEDIATRIC BOTTLE 2CC  Final   Culture   Final    NO GROWTH < 12 HOURS Performed at [facility], [address] [place], [place] [address]    Report Status PENDING  Incomplete  Urine culture     Status: None   Collection Time: 05/07/16 11:29 PM  Result Value Ref Range Status   Specimen Description URINE, CLEAN CATCH  Final   Special Requests NONE  Final   Culture   Final    NO GROWTH Performed at [facility], [address], [place], [place] [address]    Report Status 05/09/2016 FINAL  Final  MRSA PCR Screening     Status: None   Collection Time: 05/08/16  1:20 AM  Result Value Ref Range Status   MRSA by PCR NEGATIVE NEGATIVE Final    Comment:        The GeneXpert MRSA Assay (FDA approved for NASAL specimens only), is one component of a comprehensive MRSA colonization surveillance program.  It is not intended to diagnose MRSA infection nor to guide or monitor treatment for MRSA infections.   Culture, blood (routine x 2)     Status: None (Preliminary result)   Collection Time: 05/08/16  2:20 AM  Result Value Ref Range Status   Specimen Description BLOOD LEFT HAND  Final   Special Requests IN PEDIATRIC BOTTLE 2CC  Final   Culture   Final    NO GROWTH < 12 HOURS Performed at [facility], [address] [place], [place] [address]    Report Status PENDING  Incomplete     Labs: Basic Metabolic Panel:  Recent Labs Lab 05/07/16 2116 05/08/16 0244 05/08/16 0659 05/08/16 1901 05/09/16 0415  NA 138 138 138  --  138  K 4.0 3.7 3.4*  --  3.8  CL 106 108 109  --  105  CO2 17* 17* 20*  --  25  GLUCOSE 248* 230* 195* 365* 225*  BUN 43* 35* 27*  --  22*  CREATININE 1.41* 1.21 1.10  --  1.02  CALCIUM 10.3 9.4 9.2  --  9.8  MG 2.2  --   --   --   --    Liver Function Tests: No results for input(s): AST, ALT, ALKPHOS, BILITOT, PROT, ALBUMIN in the last 168 hours. No results for input(s): LIPASE, AMYLASE in the last 168 hours. No results for input(s): AMMONIA in the last 168 hours. CBC:  Recent Labs Lab 05/07/16 2116 05/09/16 0415  WBC  15.4* 14.9*  NEUTROABS  --  11.5*  HGB 13.0 12.8*  HCT 35.5* 35.7*  MCV 86.6 89.7  PLT 207 195   Cardiac Enzymes:  Recent Labs Lab 05/07/16 2116  TROPONINI <0.03   BNP: BNP (last 3 results) No results for input(s): BNP in the last 8760 hours.  ProBNP (last 3 results) No results for input(s): PROBNP in the last 8760 hours.  CBG:  Recent Labs Lab 05/08/16 1725 05/08/16 1829 05/08/16 1949 05/09/16 0409 05/09/16 0800  GLUCAP 356* 342* 256* 224* 178*       Signed:  Rhetta Mura MD   [facility] 05/09/2016, 10:35 AM

## 2016-05-13 LAB — CULTURE, BLOOD (ROUTINE X 2)
CULTURE: NO GROWTH
Culture: NO GROWTH

## 2018-02-23 IMAGING — DX DG CHEST 1V PORT
1 series · 1 of 1 positions shown · non-contrast
Comparison: None.

CLINICAL DATA: Initial evaluation for acute nausea, vomiting.

EXAM:
PORTABLE CHEST 1 VIEW

[chest ap]
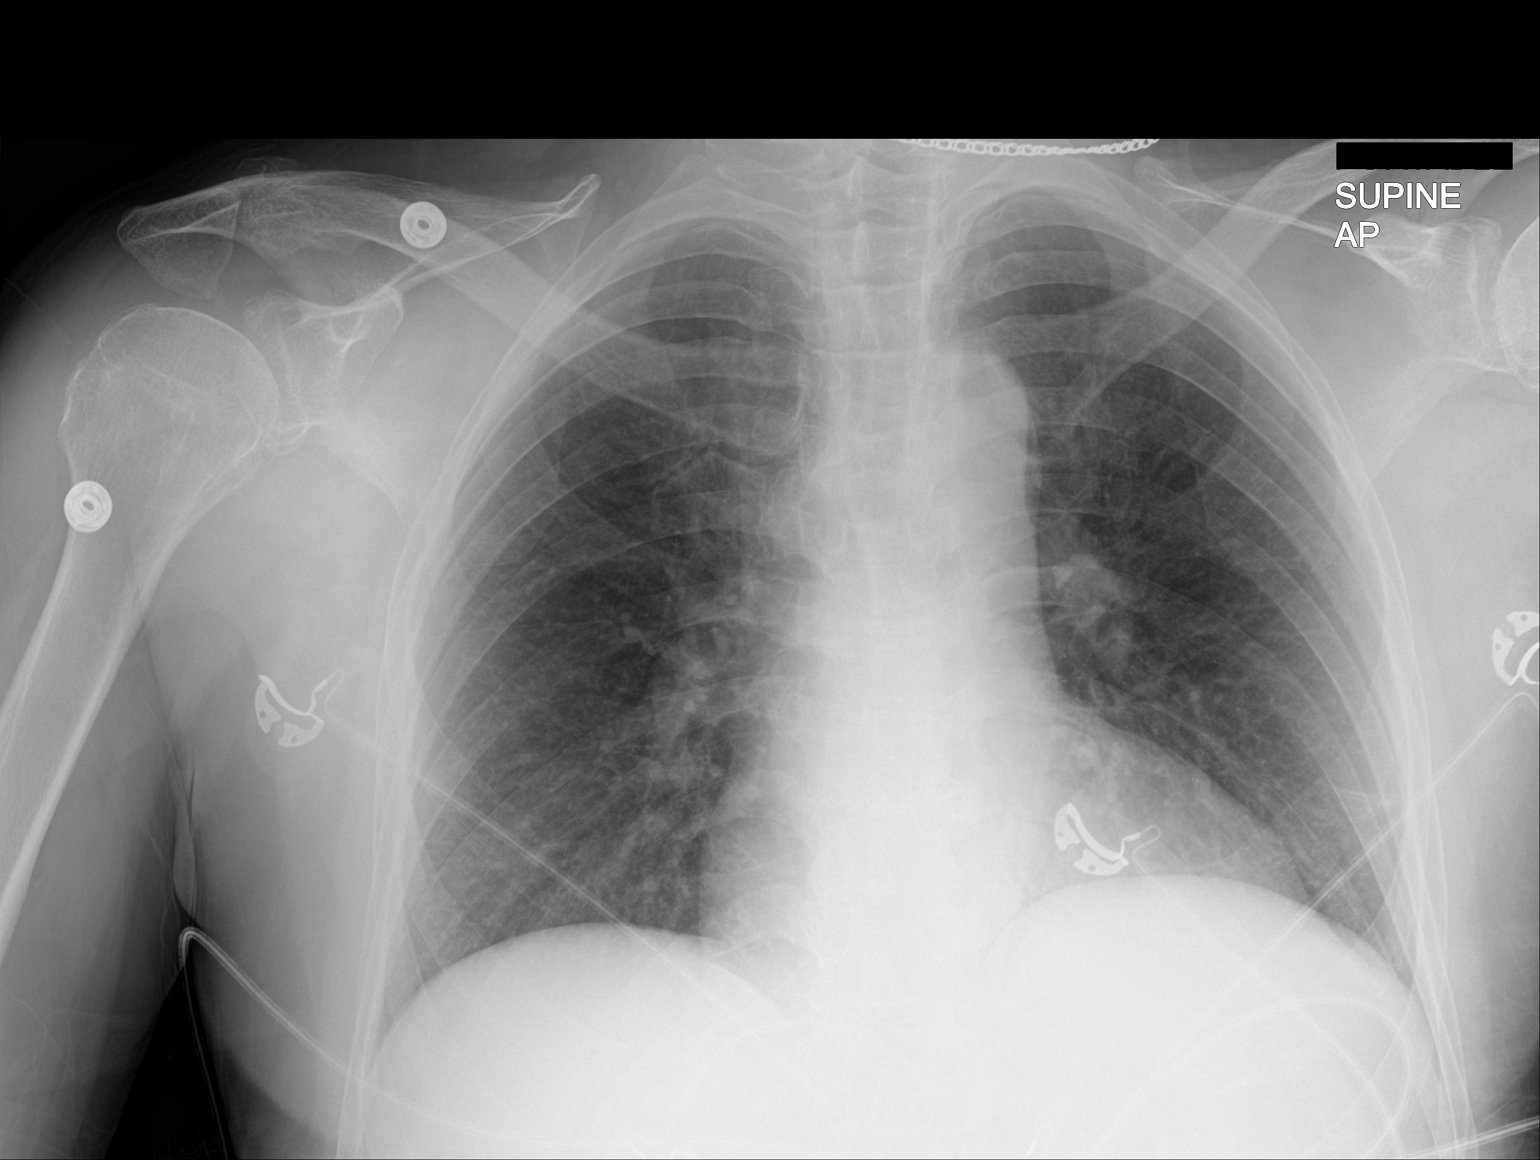

[1 of 1 positions shown; findings below may reference images not displayed]

FINDINGS: Mild cardiomegaly.  Mediastinal silhouette within normal limits.

Lungs are hypoinflated. Mild perihilar vascular congestion without
overt pulmonary edema. Linear opacity at the left lung base most
consistent with subsegmental atelectasis. No focal infiltrates. No
pleural effusion. No pneumothorax.

No acute osseus abnormality.
IMPRESSION: 1. Shallow lung inflation with mild left basilar atelectasis.
2. Mild cardiomegaly with perihilar vascular congestion. No overt
pulmonary edema.
